# Patient Record
Sex: Female | Born: 1940 | Race: Asian | Hispanic: No | Marital: Married | Smoking: Never smoker
Health system: Southern US, Community
[De-identification: ages and names within clinical notes are randomized; demographics above are authoritative.]

## PROBLEM LIST (undated history)

## (undated) DIAGNOSIS — J45909 Unspecified asthma, uncomplicated: Secondary | ICD-10-CM

## (undated) DIAGNOSIS — C569 Malignant neoplasm of unspecified ovary: Secondary | ICD-10-CM

## (undated) HISTORY — PX: ABDOMINAL HYSTERECTOMY: SHX81

---

## 2013-10-09 ENCOUNTER — Encounter (HOSPITAL_COMMUNITY): Payer: Self-pay | Admitting: Emergency Medicine

## 2013-10-09 ENCOUNTER — Emergency Department (HOSPITAL_COMMUNITY)
Admission: EM | Admit: 2013-10-09 | Discharge: 2013-10-09 | Disposition: A | Discharge status: Home / Self Care | Payer: Medicare Other | Attending: Emergency Medicine | Admitting: Emergency Medicine

## 2013-10-09 ENCOUNTER — Emergency Department (HOSPITAL_COMMUNITY): Payer: Medicare Other

## 2013-10-09 DIAGNOSIS — R0789 Other chest pain: Secondary | ICD-10-CM | POA: Diagnosis not present

## 2013-10-09 DIAGNOSIS — Z8543 Personal history of malignant neoplasm of ovary: Secondary | ICD-10-CM | POA: Diagnosis not present

## 2013-10-09 DIAGNOSIS — I10 Essential (primary) hypertension: Secondary | ICD-10-CM | POA: Diagnosis not present

## 2013-10-09 DIAGNOSIS — J45901 Unspecified asthma with (acute) exacerbation: Secondary | ICD-10-CM | POA: Insufficient documentation

## 2013-10-09 DIAGNOSIS — R0602 Shortness of breath: Secondary | ICD-10-CM | POA: Diagnosis present

## 2013-10-09 HISTORY — DX: Unspecified asthma, uncomplicated: J45.909

## 2013-10-09 HISTORY — DX: Malignant neoplasm of unspecified ovary: C56.9

## 2013-10-09 LAB — BASIC METABOLIC PANEL
BUN: 21 mg/dL (ref 6–23)
CHLORIDE: 99 meq/L (ref 96–112)
CO2: 22 mEq/L (ref 19–32)
Calcium: 9.4 mg/dL (ref 8.4–10.5)
Creatinine, Ser: 0.96 mg/dL (ref 0.50–1.10)
GFR calc Af Amer: 68 mL/min — ABNORMAL LOW (ref >90)
GFR calc non Af Amer: 59 mL/min — ABNORMAL LOW (ref >90)
Glucose, Bld: 122 mg/dL — ABNORMAL HIGH (ref 70–99)
POTASSIUM: 3.4 meq/L — AB (ref 3.7–5.3)
SODIUM: 137 meq/L (ref 137–147)

## 2013-10-09 LAB — URINALYSIS, ROUTINE W REFLEX MICROSCOPIC
Bilirubin Urine: NEGATIVE
GLUCOSE, UA: NEGATIVE mg/dL
Hgb urine dipstick: NEGATIVE
Ketones, ur: NEGATIVE mg/dL
Leukocytes, UA: NEGATIVE
Nitrite: NEGATIVE
PROTEIN: NEGATIVE mg/dL
SPECIFIC GRAVITY, URINE: 1.017 (ref 1.005–1.030)
Urobilinogen, UA: 0.2 mg/dL (ref 0.0–1.0)
pH: 6 (ref 5.0–8.0)

## 2013-10-09 LAB — CBC WITH DIFFERENTIAL/PLATELET
Basophils Absolute: 0 10*3/uL (ref 0.0–0.1)
Basophils Relative: 0 % (ref 0–1)
Eosinophils Absolute: 0.1 10*3/uL (ref 0.0–0.7)
Eosinophils Relative: 2 % (ref 0–5)
HCT: 38.3 % (ref 36.0–46.0)
HEMOGLOBIN: 12.8 g/dL (ref 12.0–15.0)
LYMPHS ABS: 1.9 10*3/uL (ref 0.7–4.0)
LYMPHS PCT: 28 % (ref 12–46)
MCH: 29.5 pg (ref 26.0–34.0)
MCHC: 33.4 g/dL (ref 30.0–36.0)
MCV: 88.2 fL (ref 78.0–100.0)
MONOS PCT: 7 % (ref 3–12)
Monocytes Absolute: 0.5 10*3/uL (ref 0.1–1.0)
NEUTROS ABS: 4.3 10*3/uL (ref 1.7–7.7)
NEUTROS PCT: 63 % (ref 43–77)
PLATELETS: 215 10*3/uL (ref 150–400)
RBC: 4.34 MIL/uL (ref 3.87–5.11)
RDW: 12.9 % (ref 11.5–15.5)
WBC: 6.8 10*3/uL (ref 4.0–10.5)

## 2013-10-09 LAB — D-DIMER, QUANTITATIVE (NOT AT ARMC): D DIMER QUANT: 0.32 ug{FEU}/mL (ref 0.00–0.48)

## 2013-10-09 LAB — TROPONIN I: Troponin I: <0.3 ng/mL (ref <0.30)

## 2013-10-09 LAB — PRO B NATRIURETIC PEPTIDE: PRO B NATRI PEPTIDE: 111.5 pg/mL (ref 0–125)

## 2013-10-09 MED ORDER — IPRATROPIUM BROMIDE 0.02 % IN SOLN
0.5000 mg | Freq: Once | RESPIRATORY_TRACT | Status: AC
  Administered 2013-10-09: 0.5 mg via RESPIRATORY_TRACT
  Filled 2013-10-09: qty 2.5

## 2013-10-09 MED ORDER — PREDNISONE 50 MG PO TABS: 50.0000 mg | ORAL_TABLET | Freq: Every day | ORAL | Status: AC

## 2013-10-09 MED ORDER — IPRATROPIUM-ALBUTEROL 0.5-2.5 (3) MG/3ML IN SOLN
3.0000 mL | Freq: Once | RESPIRATORY_TRACT | Status: AC
  Administered 2013-10-09: 3 mL via RESPIRATORY_TRACT
  Filled 2013-10-09: qty 3

## 2013-10-09 MED ORDER — ALBUTEROL (5 MG/ML) CONTINUOUS INHALATION SOLN
10.0000 mg/h | INHALATION_SOLUTION | Freq: Once | RESPIRATORY_TRACT | Status: AC
  Administered 2013-10-09: 10 mg/h via RESPIRATORY_TRACT
  Filled 2013-10-09: qty 20

## 2013-10-09 MED ORDER — ALBUTEROL SULFATE HFA 108 (90 BASE) MCG/ACT IN AERS: 1.0000 | INHALATION_SPRAY | Freq: Four times a day (QID) | RESPIRATORY_TRACT | Status: AC | PRN

## 2013-10-09 NOTE — ED Provider Notes (Signed)
CSN: [id number]     Arrival date & time 10/09/13  0106 History   First MD Initiated Contact with Patient 10/09/13 0221     Chief Complaint  Patient presents with  . Shortness of Breath     (Consider location/radiation/quality/duration/timing/severity/associated sxs/prior Treatment) HPI Comments: Pt comes in with cc of dib. Pt has hx of asthma and HTN. Reports that she got back from [place] 2 weeks ago ,and started having a cough. V'ZDG, she developed wheezing. She lives in [place], and drove down to [place], and thinks the weather change might have triggered her symptoms. Chest pain present with cough, and pt has some dib.  The history is provided by the patient.    Past Medical History  Diagnosis Date  . Asthma   . Ovarian cancer    Past Surgical History  Procedure Laterality Date  . Abdominal hysterectomy     Family History  Problem Relation Age of Onset  . Cancer Mother   . Cancer Other    History  Substance Use Topics  . Smoking status: Never Smoker   . Smokeless tobacco: Not on file  . Alcohol Use: Yes     Comment: occ   OB History   Grav Para Term Preterm Abortions TAB SAB Ect Mult Living                 Review of Systems  Constitutional: Positive for activity change.  Respiratory: Positive for cough, chest tightness and wheezing. Negative for shortness of breath.   Cardiovascular: Positive for chest pain.  Gastrointestinal: Negative for nausea, vomiting and abdominal pain.  Genitourinary: Negative for dysuria.  Musculoskeletal: Negative for neck pain.  Neurological: Negative for headaches.  Hematological: Does not bruise/bleed easily.  All other systems reviewed and are negative.      Allergies  Review of patient's allergies indicates no known allergies.  Home Medications  No current outpatient prescriptions on file. BP 162/84  Pulse 112  Temp(Src) 98.9 F (37.2 C) (Oral)  Resp 22  SpO2 96% Physical Exam  Nursing note and vitals  reviewed. Constitutional: She is oriented to person, place, and time. She appears well-developed and well-nourished.  HENT:  Head: Normocephalic and atraumatic.  Eyes: EOM are normal. Pupils are equal, round, and reactive to light.  Neck: Neck supple.  Cardiovascular: Normal rate, regular rhythm and normal heart sounds.   No murmur heard. Pulmonary/Chest: Effort normal. No respiratory distress. She has wheezes.  Abdominal: Soft. She exhibits no distension. There is no tenderness. There is no rebound and no guarding.  Musculoskeletal: She exhibits no edema and no tenderness.  Neurological: She is alert and oriented to person, place, and time.  Skin: Skin is warm and dry.    ED Course  Procedures (including critical care time) Labs Review Labs Reviewed  CBC WITH DIFFERENTIAL  BASIC METABOLIC PANEL  TROPONIN I  URINALYSIS, ROUTINE W REFLEX MICROSCOPIC  PRO B NATRIURETIC PEPTIDE   Imaging Review Dg Chest 2 View  10/09/2013   CLINICAL DATA:  Shortness of breath.  EXAM: CHEST  2 VIEW  COMPARISON:  No priors.  FINDINGS: Lung volumes are normal. No consolidative airspace disease. No pleural effusions. No pneumothorax. No pulmonary nodule or mass noted. Pulmonary vasculature and the cardiomediastinal silhouette are within normal limits. Atherosclerosis in the thoracic aorta.  IMPRESSION: 1.  No radiographic evidence of acute cardiopulmonary disease. 2. Atherosclerosis.   Electronically Signed   By: Vinnie Langton M.D.   On: 10/09/2013 03:09  EKG Interpretation   Date/Time:  Friday October 09 2013 02:51:32 EDT Ventricular Rate:  115 PR Interval:  161 QRS Duration: 82 QT Interval:  342 QTC Calculation: 473 R Axis:   -158 Text Interpretation:  Sinus tachycardia Right axis deviation Confirmed by  Kathrynn Humble, MD, Thelma Comp ([id number]) on 10/09/2013 3:12:31 AM      MDM   Final diagnoses:  None    Pt comes in with cc of dib. Pt has hx of asthma, but also multiple recent long distance  travel hx ([place], and [place] driving). She is tachycardic at arrival. Will give her more nebs for possible asthma exacerbation, but Dimer ordered (Wells score of 1.5) to screen for PE.   4:54 AM Pt is feeling a lot better. She is getting her treatments. Still tachycardic - likely from the albuterol. Will be discharged shortly.   Varney Biles, MD 10/09/13 [phone]

## 2013-10-09 NOTE — Discharge Instructions (Signed)
Asthma, Adult Asthma is a recurring condition in which the airways tighten and narrow. Asthma can make it difficult to breathe. It can cause coughing, wheezing, and shortness of breath. Asthma episodes (also called asthma attacks) range from minor to life-threatening. Asthma cannot be cured, but medicines and lifestyle changes can help control it. CAUSES Asthma is believed to be caused by inherited (genetic) and environmental factors, but its exact cause is unknown. Asthma may be triggered by allergens, lung infections, or irritants in the air. Asthma triggers are different for each person. Common triggers include:   Animal dander.  Dust mites.  Cockroaches.  Pollen from trees or grass.  Mold.  Smoke.  Air pollutants such as dust, household cleaners, hair sprays, aerosol sprays, paint fumes, strong chemicals, or strong odors.  Cold air, weather changes, and winds (which increase molds and pollens in the air).  Strong emotional expressions such as crying or laughing hard.  Stress.  Certain medicines (such as aspirin) or types of drugs (such as beta-blockers).  Sulfites in foods and drinks. Foods and drinks that may contain sulfites include dried fruit, potato chips, and sparkling grape juice.  Infections or inflammatory conditions such as the flu, a cold, or an inflammation of the nasal membranes (rhinitis).  Gastroesophageal reflux disease (GERD).  Exercise or strenuous activity. SYMPTOMS Symptoms may occur immediately after asthma is triggered or many hours later. Symptoms include:  Wheezing.  Excessive nighttime or early morning coughing.  Frequent or severe coughing with a common cold.  Chest tightness.  Shortness of breath. DIAGNOSIS  The diagnosis of asthma is made by a review of your medical history and a physical exam. Tests may also be performed. These may include:  Lung function studies. These tests show how much air you breath in and out.  Allergy  tests.  Imaging tests such as X-rays. TREATMENT  Asthma cannot be cured, but it can usually be controlled. Treatment involves identifying and avoiding your asthma triggers. It also involves medicines. There are 2 classes of medicine used for asthma treatment:   Controller medicines. These prevent asthma symptoms from occurring. They are usually taken every day.  Reliever or rescue medicines. These quickly relieve asthma symptoms. They are used as needed and provide short-term relief. Your health care provider will help you create an asthma action plan. An asthma action plan is a written plan for managing and treating your asthma attacks. It includes a list of your asthma triggers and how they may be avoided. It also includes information on when medicines should be taken and when their dosage should be changed. An action plan may also involve the use of a device called a peak flow meter. A peak flow meter measures how well the lungs are working. It helps you monitor your condition. HOME CARE INSTRUCTIONS   Take medicine as directed by your health care provider. Speak with your health care provider if you have questions about how or when to take the medicines.  Use a peak flow meter as directed by your health care provider. Record and keep track of readings.  Understand and use the action plan to help minimize or stop an asthma attack without needing to seek medical care.  Control your home environment in the following ways to help prevent asthma attacks:  Do not smoke. Avoid being exposed to secondhand smoke.  Change your heating and air conditioning filter regularly.  Limit your use of fireplaces and wood stoves.  Get rid of pests (such as roaches and   mice) and their droppings.  Throw away plants if you see mold on them.  Clean your floors and dust regularly. Use unscented cleaning products.  Try to have someone else vacuum for you regularly. Stay out of rooms while they are being  vacuumed and for a short while afterward. If you vacuum, use a dust mask from a hardware store, a double-layered or microfilter vacuum cleaner bag, or a vacuum cleaner with a HEPA filter.  Replace carpet with wood, tile, or vinyl flooring. Carpet can trap dander and dust.  Use allergy-proof pillows, mattress covers, and box spring covers.  Wash bed sheets and blankets every week in hot water and dry them in a dryer.  Use blankets that are made of polyester or cotton.  Clean bathrooms and kitchens with bleach. If possible, have someone repaint the walls in these rooms with mold-resistant paint. Keep out of the rooms that are being cleaned and painted.  Wash hands frequently. SEEK MEDICAL CARE IF:   You have wheezing, shortness of breath, or a cough even if taking medicine to prevent attacks.  The colored mucus you cough up (sputum) is thicker than usual.  Your sputum changes from clear or white to yellow, green, gray, or bloody.  You have any problems that may be related to the medicines you are taking (such as a rash, itching, swelling, or trouble breathing).  You are using a reliever medicine more than 2 3 times per week.  Your peak flow is still at 50 79% of you personal best after following your action plan for 1 hour. SEEK IMMEDIATE MEDICAL CARE IF:   You seem to be getting worse and are unresponsive to treatment during an asthma attack.  You are short of breath even at rest.  You get short of breath when doing very little physical activity.  You have difficulty eating, drinking, or talking due to asthma symptoms.  You develop chest pain.  You develop a fast heartbeat.  You have a bluish color to your lips or fingernails.  You are lightheaded, dizzy, or faint.  Your peak flow is less than 50% of your personal best.  You have a fever or persistent symptoms for more than 2 3 days.  You have a fever and symptoms suddenly get worse. MAKE SURE YOU:   Understand these  instructions.  Will watch your condition.  Will get help right away if you are not doing well or get worse. Document Released: 07/02/2005 Document Revised: 03/04/2013 Document Reviewed: 01/29/2013 [employer] Patient Information 2014 [employer].  

## 2013-10-09 NOTE — ED Notes (Signed)
Pt states she started feeling short of breath yesterday and it has progressively gotten worse  Pt states she has been coughing a lot  Pt has hx of asthma

## 2015-03-23 IMAGING — CR DG CHEST 2V
2 series · 2 of 2 positions shown · non-contrast
Comparison: No priors.

CLINICAL DATA: Shortness of breath.

EXAM:
CHEST  2 VIEW

[w chest pa]
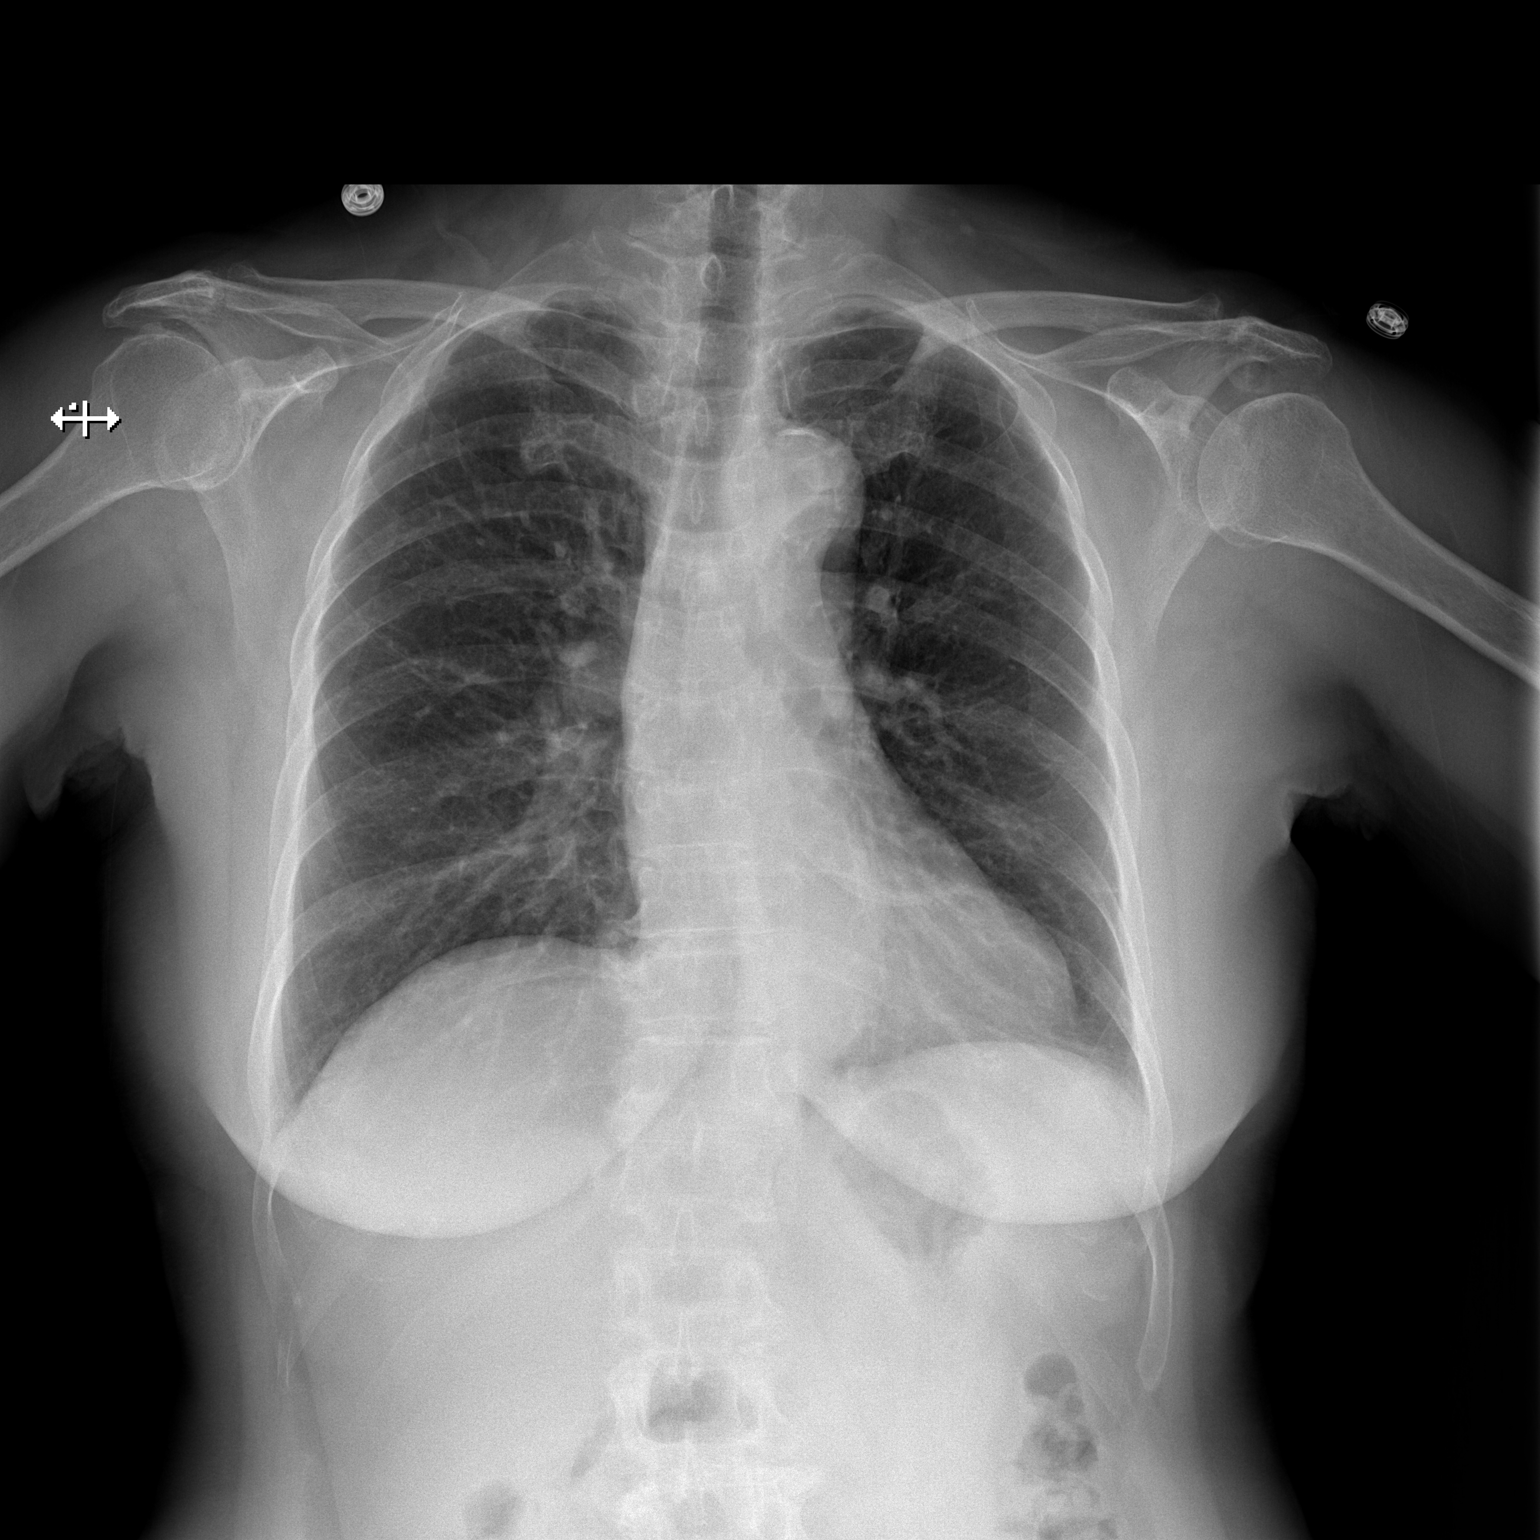

[w chest lat]
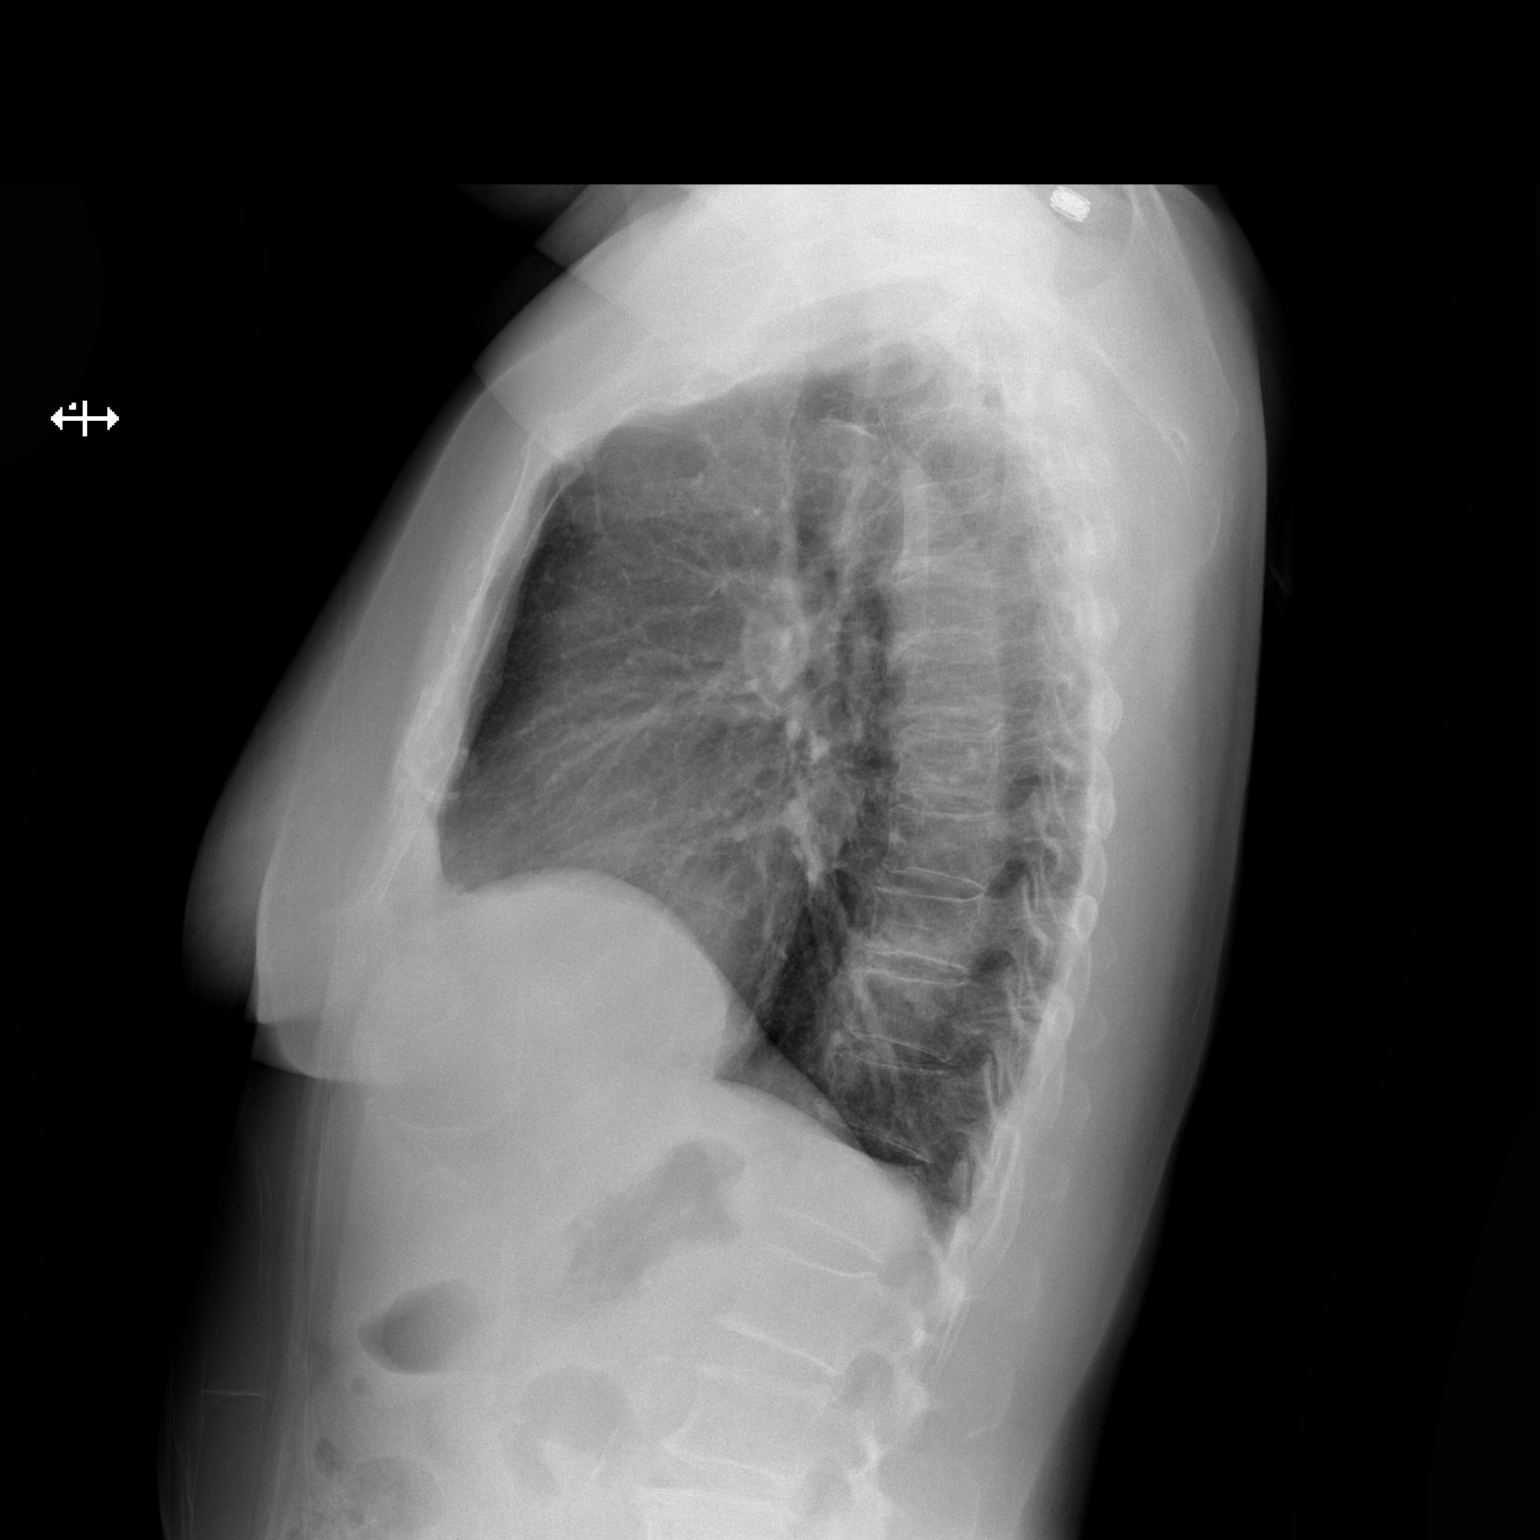

[2 of 2 positions shown; findings below may reference images not displayed]

FINDINGS: Lung volumes are normal. No consolidative airspace disease. No
pleural effusions. No pneumothorax. No pulmonary nodule or mass
noted. Pulmonary vasculature and the cardiomediastinal silhouette
are within normal limits. Atherosclerosis in the thoracic aorta.
IMPRESSION: 1.  No radiographic evidence of acute cardiopulmonary disease.
2. Atherosclerosis.
